# Patient Record
Sex: Female | Born: 1975 | Race: Black or African American | Hispanic: No | Marital: Married | Smoking: Former smoker
Health system: Southern US, Community
[De-identification: ages and names within clinical notes are randomized; demographics above are authoritative.]

## PROBLEM LIST (undated history)

## (undated) HISTORY — PX: FOOT SURGERY: SHX648

---

## 2003-07-31 ENCOUNTER — Other Ambulatory Visit: Admission: RE | Admit: 2003-07-31 | Discharge: 2003-07-31 | Payer: Self-pay | Admitting: Family Medicine

## 2005-03-18 ENCOUNTER — Other Ambulatory Visit: Admission: RE | Admit: 2005-03-18 | Discharge: 2005-03-18 | Payer: Self-pay | Admitting: Family Medicine

## 2005-11-10 ENCOUNTER — Ambulatory Visit: Payer: Self-pay | Admitting: Family Medicine

## 2006-12-04 ENCOUNTER — Encounter: Admission: RE | Admit: 2006-12-04 | Discharge: 2006-12-04 | Payer: Self-pay | Admitting: Family Medicine

## 2006-12-04 ENCOUNTER — Ambulatory Visit: Payer: Self-pay | Admitting: Family Medicine

## 2006-12-14 ENCOUNTER — Ambulatory Visit: Payer: Self-pay | Admitting: Family Medicine

## 2007-01-13 ENCOUNTER — Other Ambulatory Visit: Admission: RE | Admit: 2007-01-13 | Discharge: 2007-01-13 | Payer: Self-pay | Admitting: Family Medicine

## 2007-01-13 ENCOUNTER — Ambulatory Visit: Payer: Self-pay | Admitting: Family Medicine

## 2007-07-22 ENCOUNTER — Ambulatory Visit: Payer: Self-pay | Admitting: Family Medicine

## 2008-01-19 ENCOUNTER — Other Ambulatory Visit: Admission: RE | Admit: 2008-01-19 | Discharge: 2008-01-19 | Payer: Self-pay | Admitting: Family Medicine

## 2008-01-19 ENCOUNTER — Ambulatory Visit: Payer: Self-pay | Admitting: Family Medicine

## 2008-01-25 ENCOUNTER — Encounter: Admission: RE | Admit: 2008-01-25 | Discharge: 2008-01-25 | Payer: Self-pay | Admitting: Family Medicine

## 2008-05-08 ENCOUNTER — Ambulatory Visit: Payer: Self-pay | Admitting: Family Medicine

## 2008-05-17 ENCOUNTER — Encounter: Admission: RE | Admit: 2008-05-17 | Discharge: 2008-05-17 | Payer: Self-pay | Admitting: Family Medicine

## 2008-09-28 IMAGING — US US PELVIS COMPLETE
1 series · 14 of 25 positions shown · non-contrast
Comparison: None

CLINICAL DATA: Right lower quadrant pain

TRANSABDOMINAL AND TRANSVAGINAL PELVIC ULTRASOUND
TECHNIQUE: Both transabdominal and transvaginal ultrasound examinations of the
pelvis were performed including evaluation of the uterus, ovaries, adnexal
regions, and pelvic cul-de-sac.

[Series 1: unknown · 0.27mm/px · 14 of 67 slices shown]
[im 1/67]
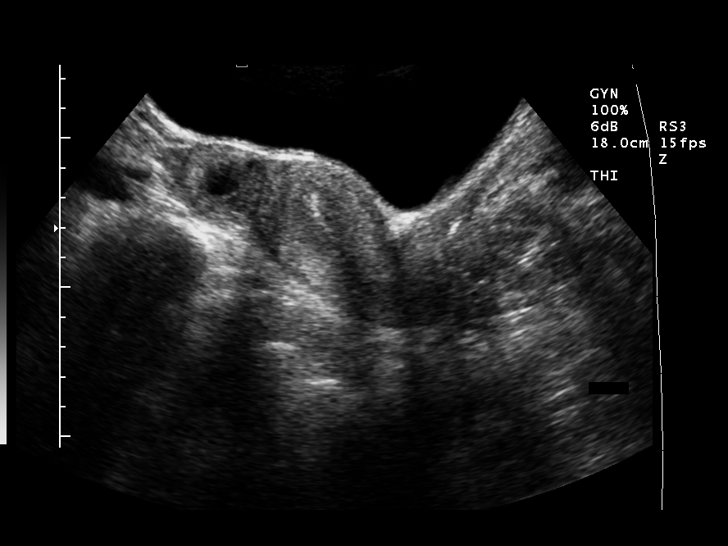
[im 6/67]
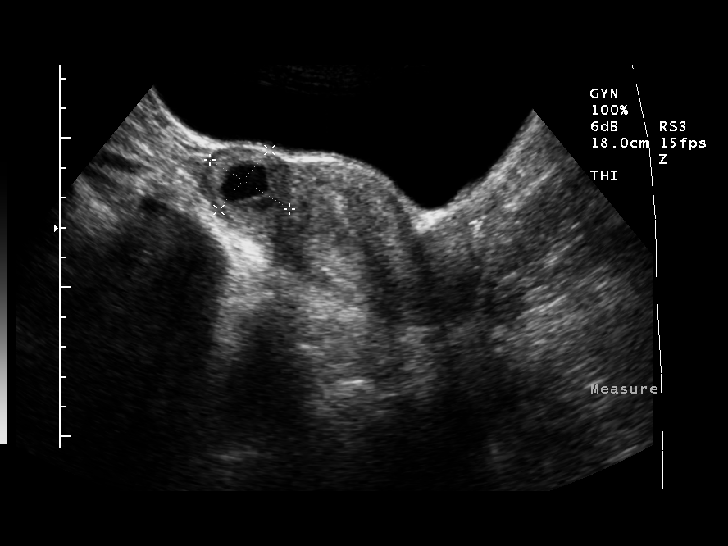
[im 12/67]
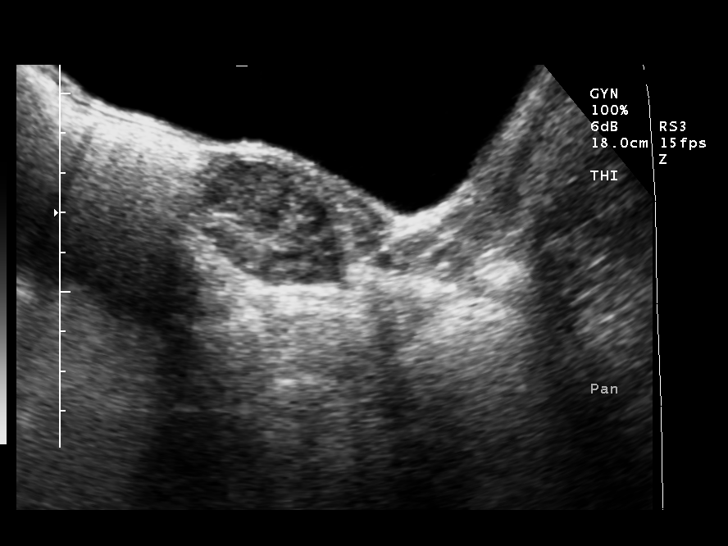
[im 17/67]
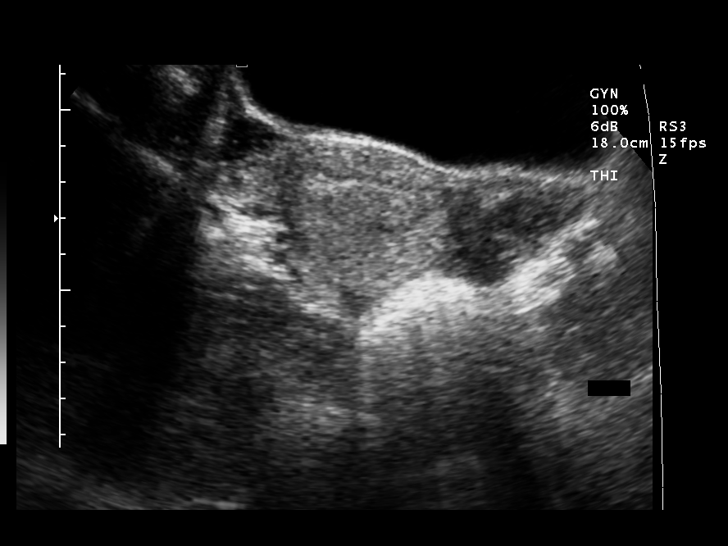
[im 23/67]
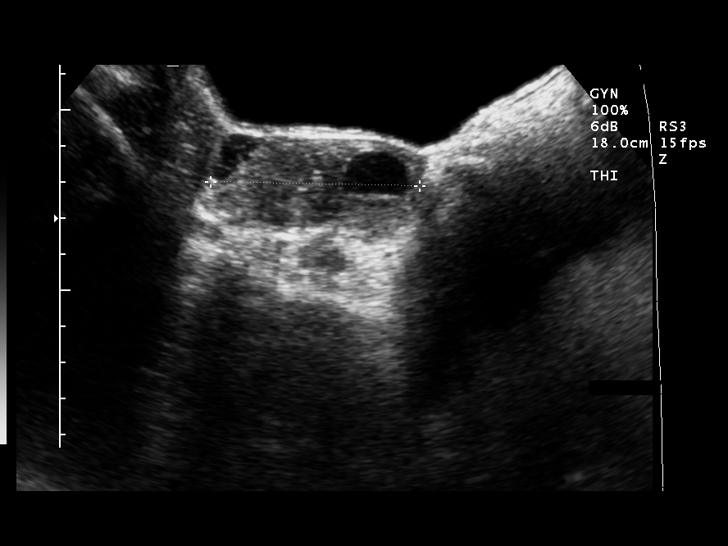
[im 25/67]
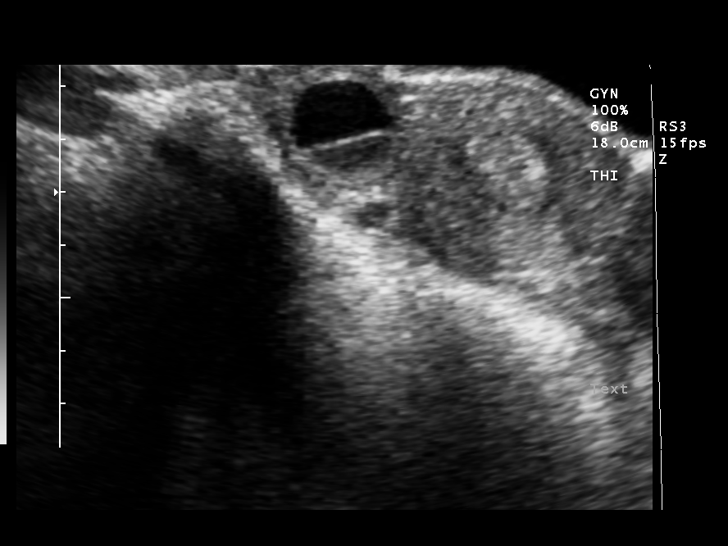
[im 31/67]
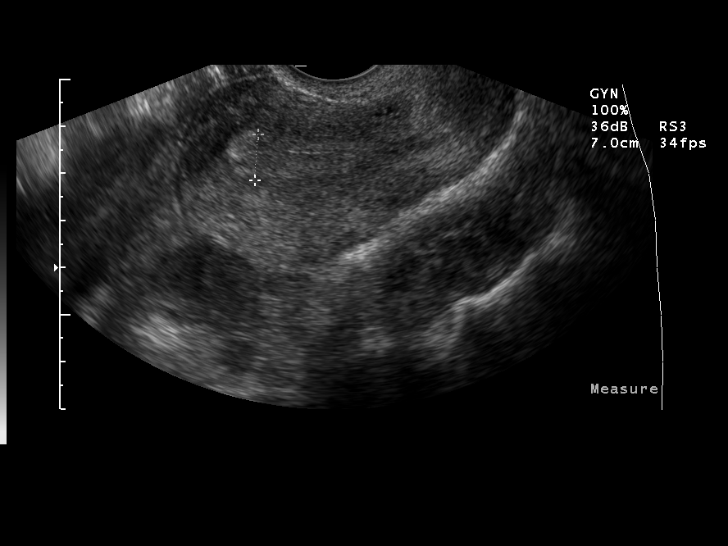
[im 36/67]
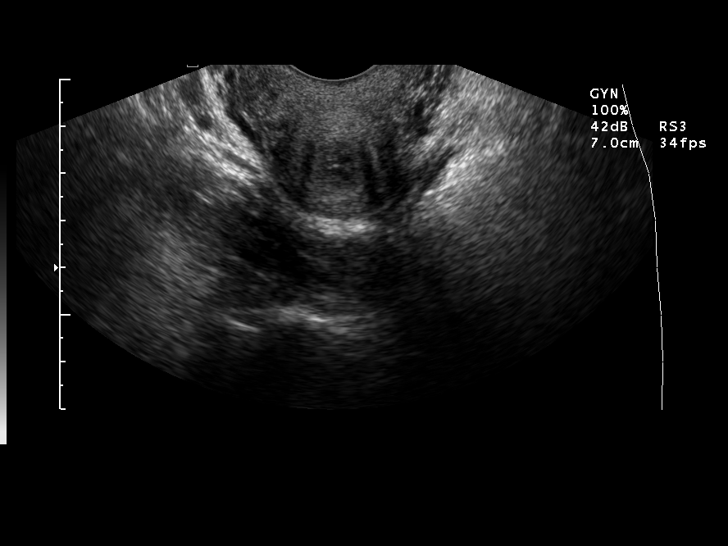
[im 42/67]
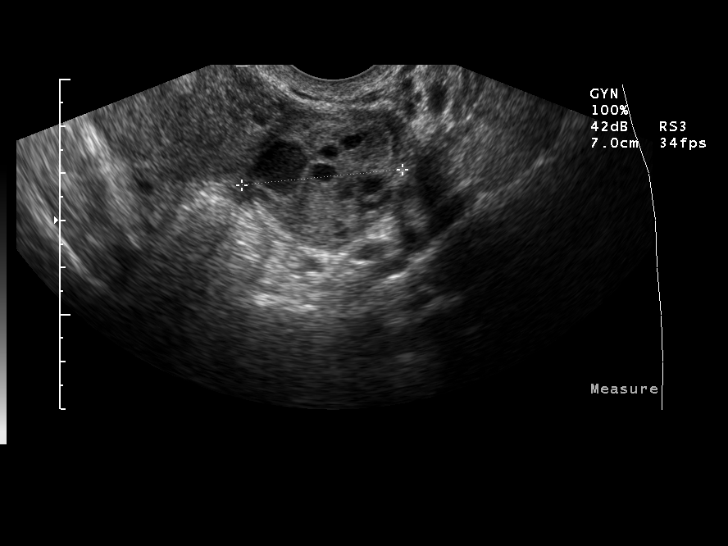
[im 45/67]
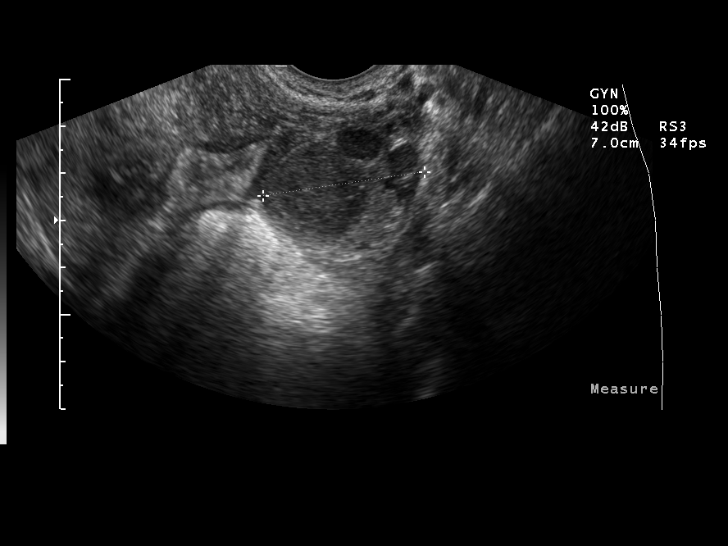
[im 50/67]
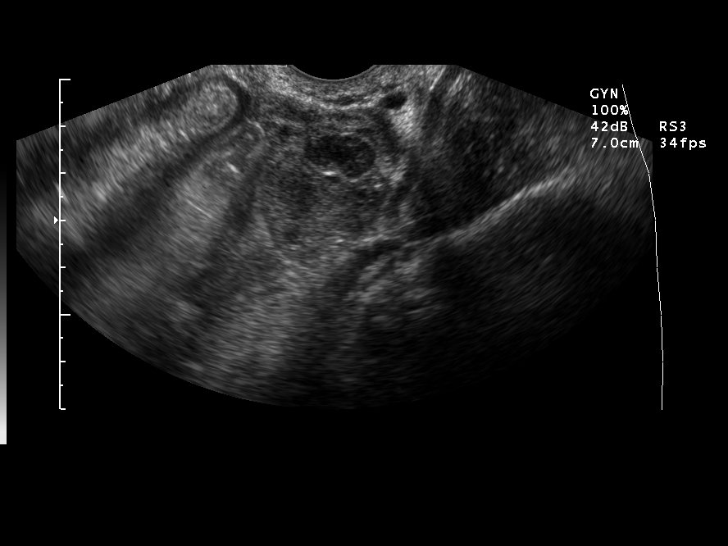
[im 56/67]
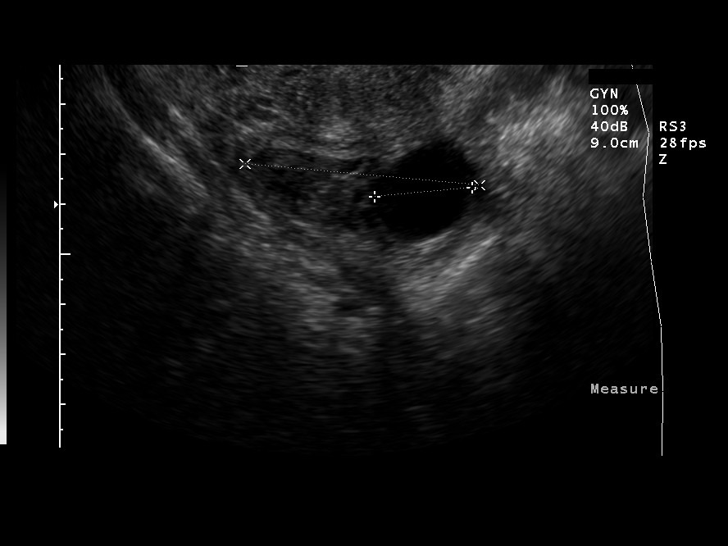
[im 61/67]
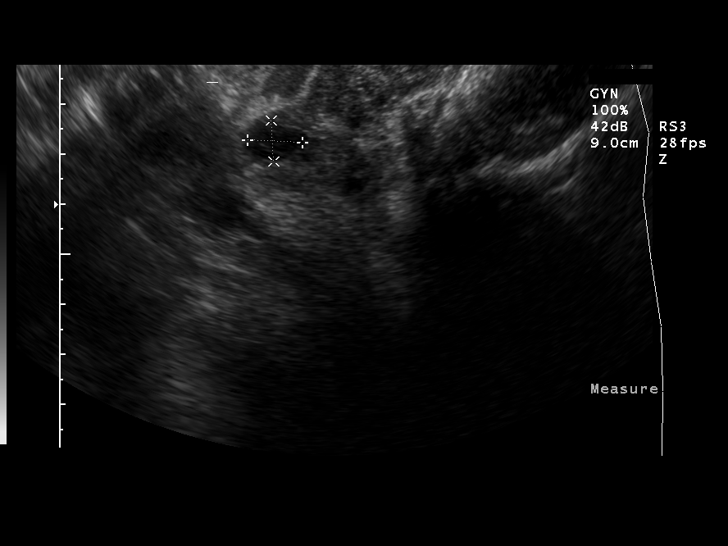
[im 67/67]
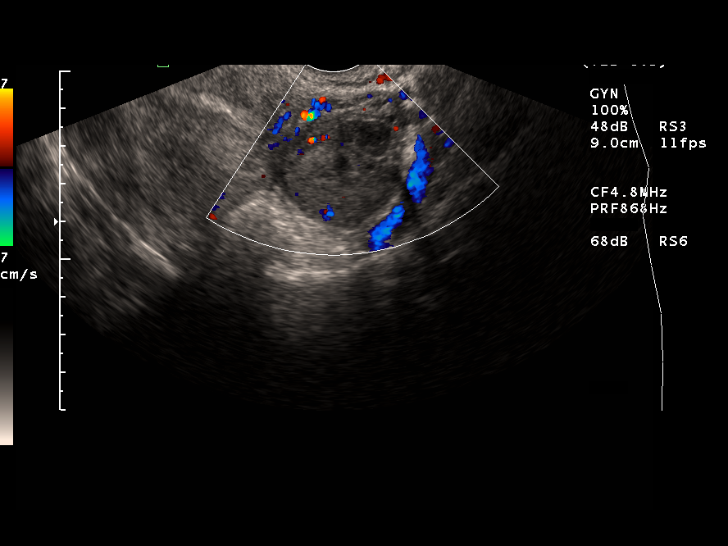

[14 of 25 positions shown; findings below may reference images not displayed]

FINDINGS: Uterus is normal size and echotexture. No focal abnormality.
Endometrium normal at 10 mm.

Right ovary measures 4.4 x 2.6 x 5.2 cm. 2 cm cyst seen within the right ovary.
Probable small 11 mm collapsing cyst.

Left ovary measures 3.6 x 3.1 x 3.4 cm. Multiple hypoechoic areas are seen
within the left ovary, the largest measuring 2.1 x 2.0 x 1.9 cm. These most
likely represent hemorrhagic cysts.

No free fluid.

IMPRESSION

Simple appearing 2 cm cyst in the right ovary.

Hypoechoic areas within the left ovary, the largest measuring 2.1 cm, most
likely hemorrhagic cysts.

## 2009-04-23 ENCOUNTER — Ambulatory Visit: Payer: Self-pay | Admitting: Physician Assistant

## 2009-04-23 ENCOUNTER — Other Ambulatory Visit: Admission: RE | Admit: 2009-04-23 | Discharge: 2009-04-23 | Payer: Self-pay | Admitting: Family Medicine

## 2009-08-02 ENCOUNTER — Other Ambulatory Visit: Admission: RE | Admit: 2009-08-02 | Discharge: 2009-08-02 | Payer: Self-pay | Admitting: Family Medicine

## 2009-08-02 ENCOUNTER — Ambulatory Visit: Payer: Self-pay | Admitting: Physician Assistant

## 2009-11-05 ENCOUNTER — Ambulatory Visit: Payer: Self-pay | Admitting: Family Medicine

## 2010-03-12 IMAGING — US US TRANSVAGINAL NON-OB
1 series · 14 of 25 positions shown · non-contrast
Comparison: 12/04/2006.

CLINICAL DATA: Dysmenorrhea.  History of ovarian cyst.

TRANSABDOMINAL AND TRANSVAGINAL ULTRASOUND OF PELVIS
TECHNIQUE: Both transabdominal and transvaginal ultrasound
examinations of the pelvis were performed including evaluation of
the uterus, ovaries, adnexal regions, and pelvic cul-de-sac.

[Series 1: us transvaginal non-ob · 0.17mm/px · 14 of 72 slices shown]
[im 1/72]
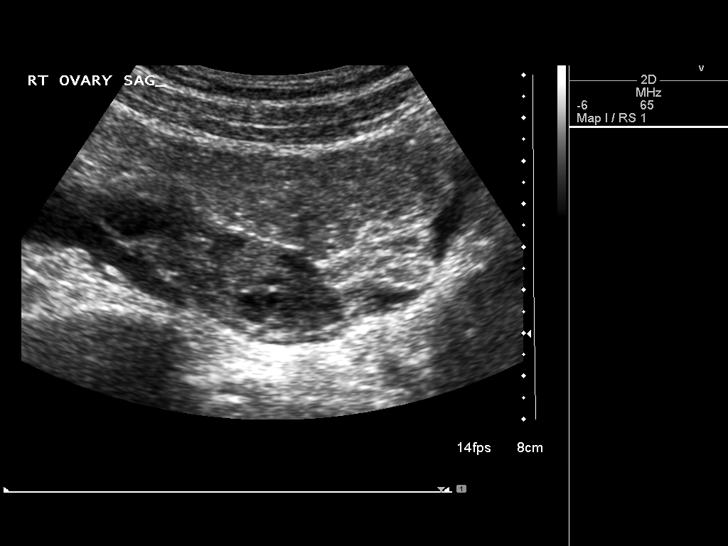
[im 6/72]
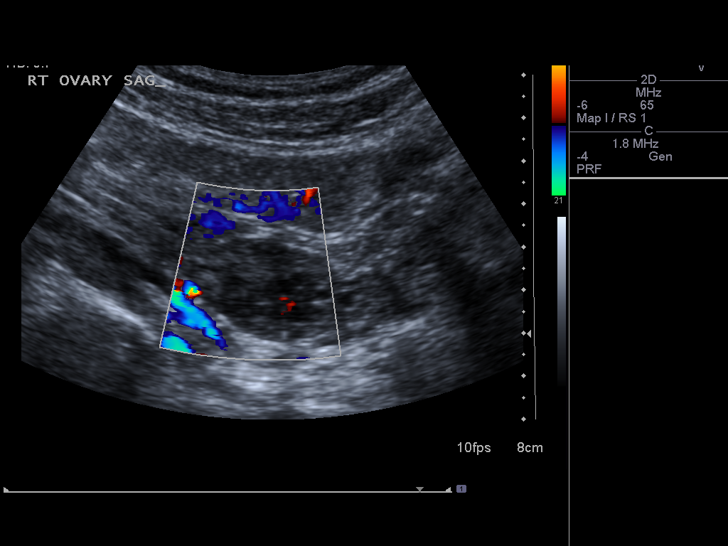
[im 12/72]
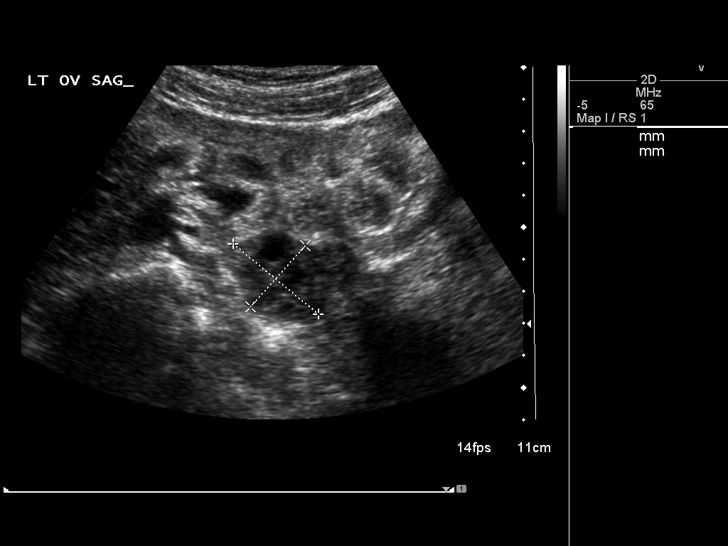
[im 18/72]
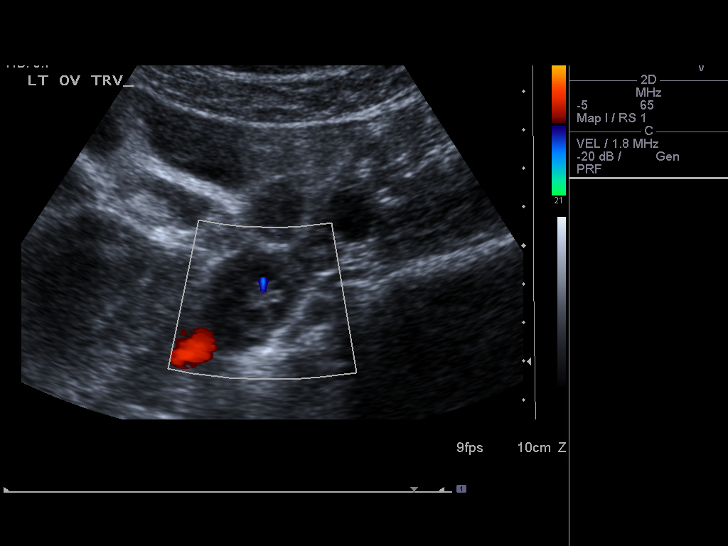
[im 24/72]
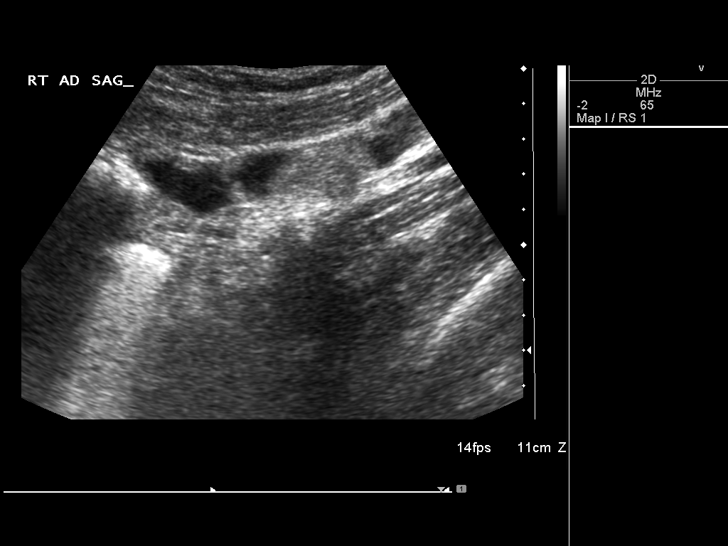
[im 27/72]
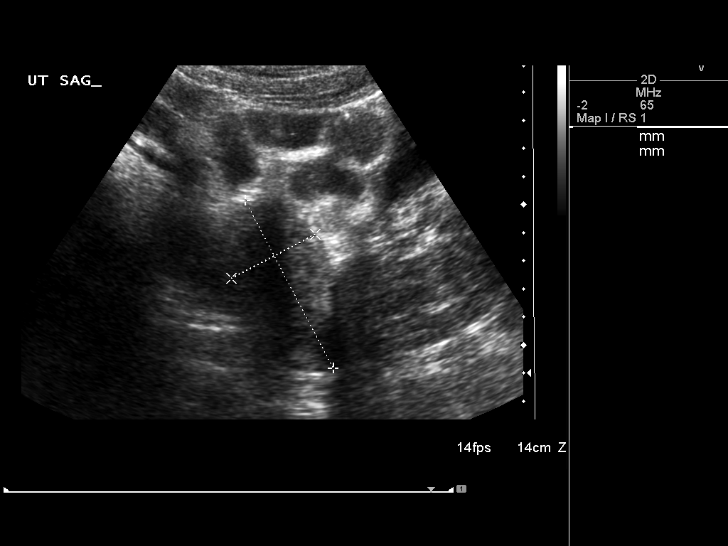
[im 33/72]
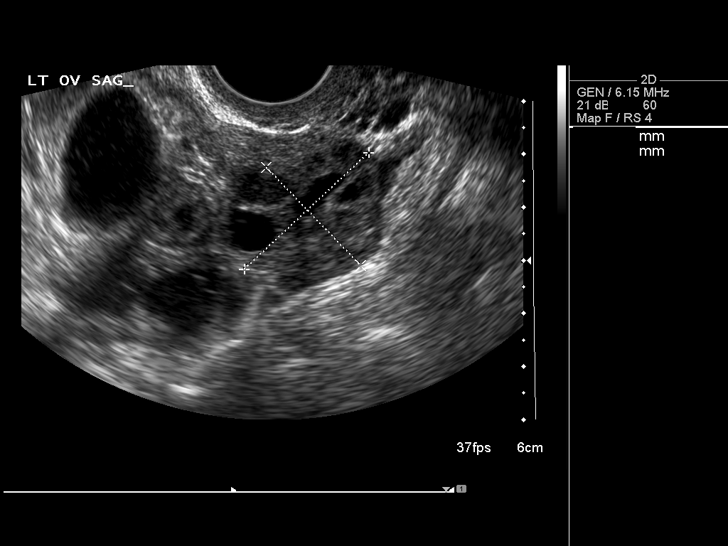
[im 39/72]
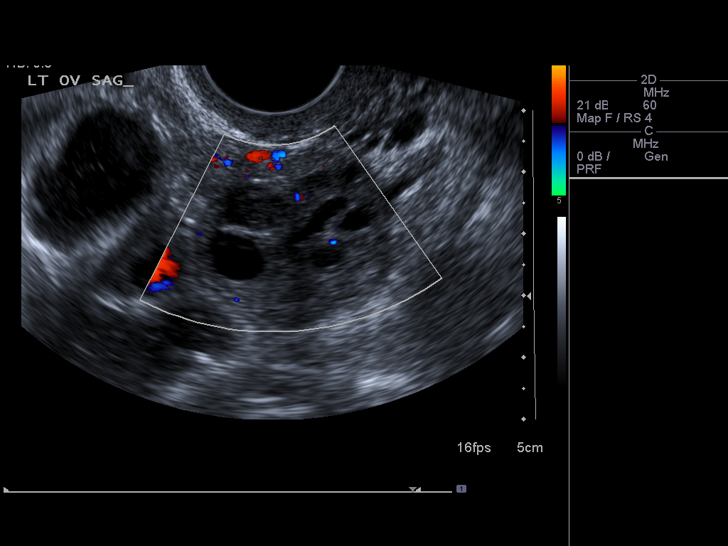
[im 45/72]
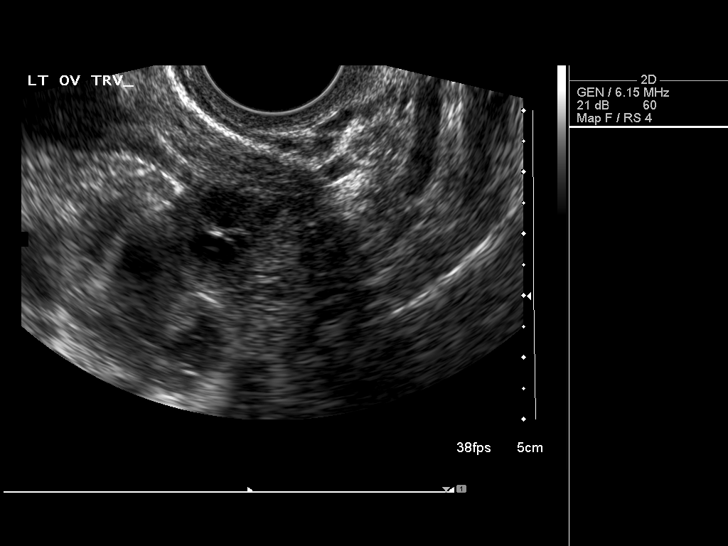
[im 48/72]
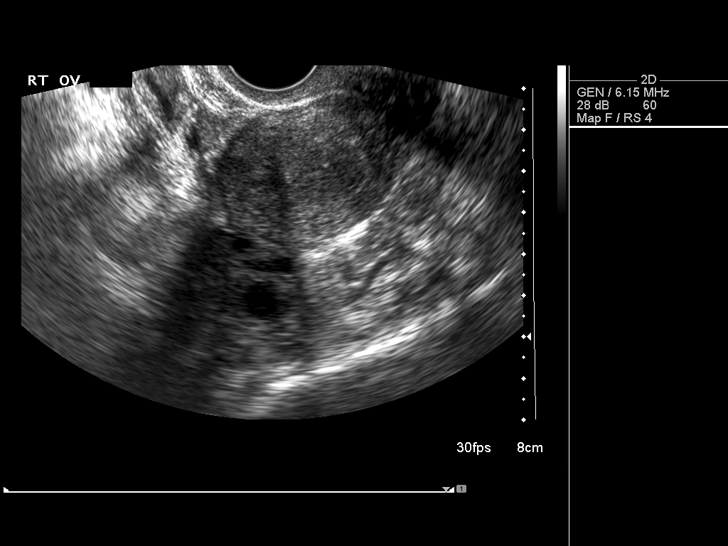
[im 54/72]
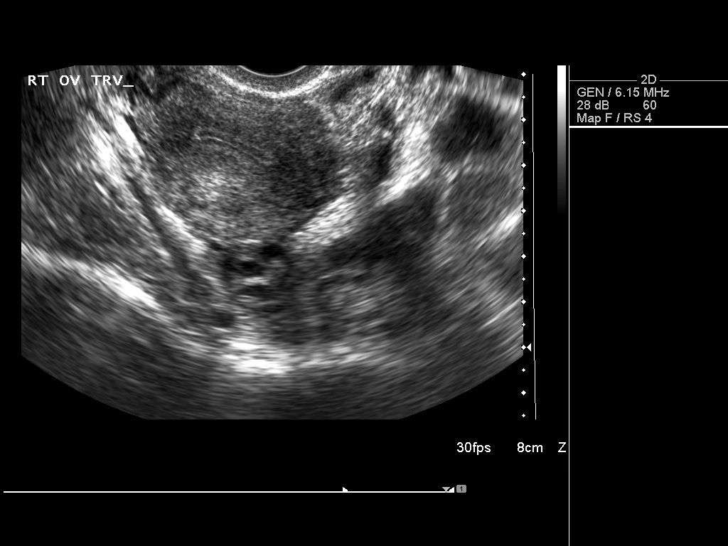
[im 60/72]
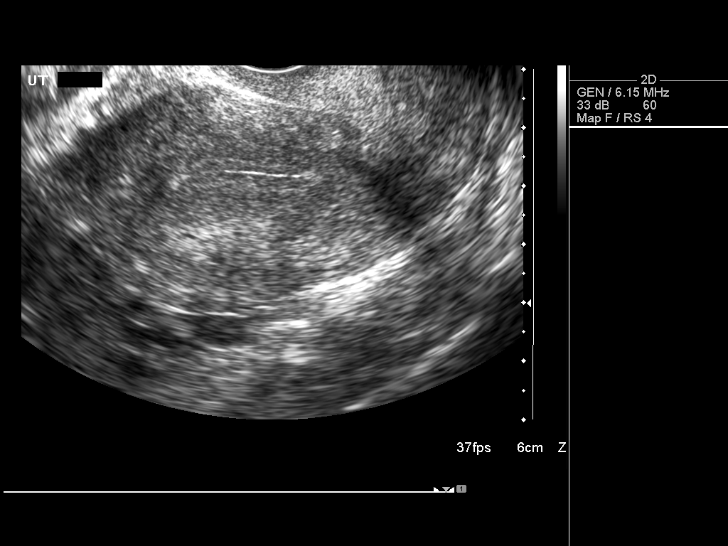
[im 66/72]
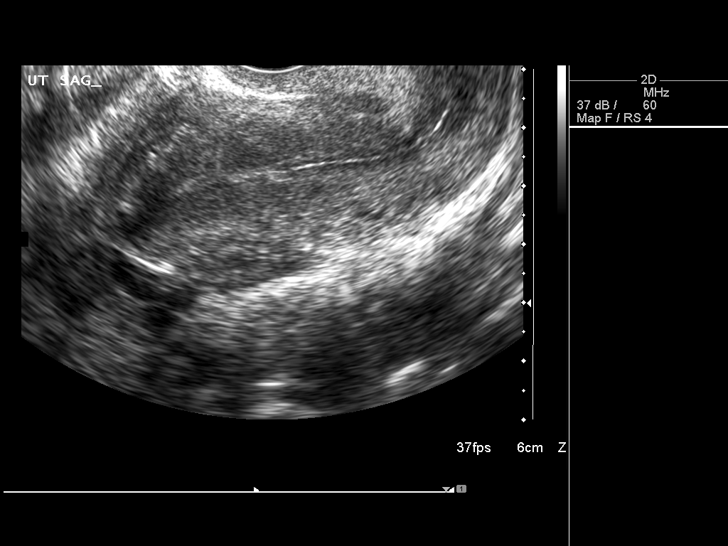
[im 72/72]
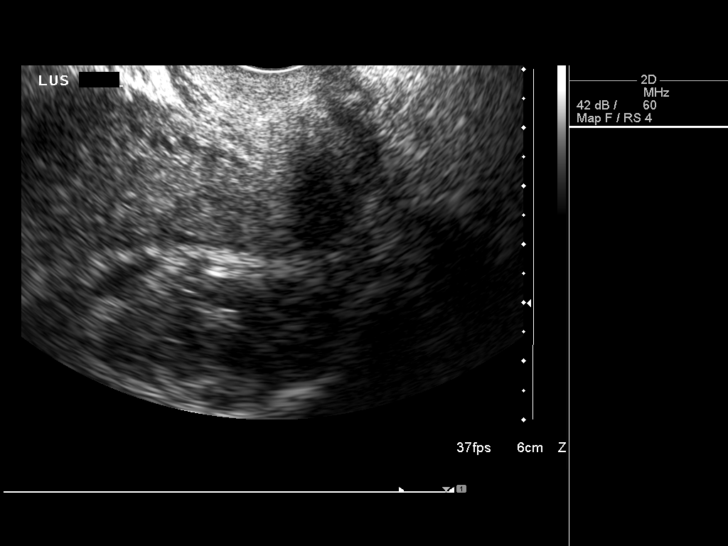

[14 of 25 positions shown; findings below may reference images not displayed]

FINDINGS: The uterus measures 6.7 x 3.6 x 4.4 cm in size.  There
are no focal uterine abnormalities.  The endometrial stripe
thickness measures 3 mm in diameter.  The right ovary measures
x 2.6 x 2.5 cm in size and the left ovary measures 3.0 x 2.0 x
cm in size.  There are small physiologic cysts associated with the
ovaries bilaterally.  There is no free pelvic fluid.
IMPRESSION: Normal study.

## 2010-03-22 ENCOUNTER — Ambulatory Visit: Payer: Self-pay | Admitting: Family Medicine

## 2010-06-02 ENCOUNTER — Encounter: Payer: Self-pay | Admitting: Family Medicine

## 2010-06-05 ENCOUNTER — Ambulatory Visit
Admission: RE | Admit: 2010-06-05 | Discharge: 2010-06-05 | Payer: Self-pay | Source: Home / Self Care | Attending: Family Medicine | Admitting: Family Medicine

## 2011-03-05 ENCOUNTER — Other Ambulatory Visit: Payer: Self-pay | Admitting: Family Medicine

## 2011-03-05 ENCOUNTER — Other Ambulatory Visit (HOSPITAL_COMMUNITY)
Admission: RE | Admit: 2011-03-05 | Discharge: 2011-03-05 | Disposition: A | Discharge status: Home / Self Care | Payer: 59 | Source: Ambulatory Visit | Attending: Family Medicine | Admitting: Family Medicine

## 2011-03-05 DIAGNOSIS — Z113 Encounter for screening for infections with a predominantly sexual mode of transmission: Secondary | ICD-10-CM | POA: Insufficient documentation

## 2011-03-05 DIAGNOSIS — Z1231 Encounter for screening mammogram for malignant neoplasm of breast: Secondary | ICD-10-CM

## 2011-03-05 DIAGNOSIS — Z803 Family history of malignant neoplasm of breast: Secondary | ICD-10-CM

## 2011-03-05 DIAGNOSIS — Z124 Encounter for screening for malignant neoplasm of cervix: Secondary | ICD-10-CM | POA: Insufficient documentation

## 2011-04-01 ENCOUNTER — Ambulatory Visit
Admission: RE | Admit: 2011-04-01 | Discharge: 2011-04-01 | Disposition: A | Discharge status: Home / Self Care | Payer: 59 | Source: Ambulatory Visit | Attending: Family Medicine | Admitting: Family Medicine

## 2011-04-01 DIAGNOSIS — Z1231 Encounter for screening mammogram for malignant neoplasm of breast: Secondary | ICD-10-CM

## 2011-04-01 DIAGNOSIS — Z803 Family history of malignant neoplasm of breast: Secondary | ICD-10-CM

## 2012-04-12 ENCOUNTER — Other Ambulatory Visit: Payer: Self-pay | Admitting: Family Medicine

## 2012-04-12 DIAGNOSIS — Z1231 Encounter for screening mammogram for malignant neoplasm of breast: Secondary | ICD-10-CM

## 2012-05-14 ENCOUNTER — Other Ambulatory Visit: Payer: Self-pay | Admitting: Family Medicine

## 2012-05-14 ENCOUNTER — Other Ambulatory Visit (HOSPITAL_COMMUNITY)
Admission: RE | Admit: 2012-05-14 | Discharge: 2012-05-14 | Disposition: A | Discharge status: Home / Self Care | Payer: 59 | Source: Ambulatory Visit | Attending: Family Medicine | Admitting: Family Medicine

## 2012-05-14 DIAGNOSIS — Z124 Encounter for screening for malignant neoplasm of cervix: Secondary | ICD-10-CM | POA: Insufficient documentation

## 2012-05-25 ENCOUNTER — Ambulatory Visit
Admission: RE | Admit: 2012-05-25 | Discharge: 2012-05-25 | Disposition: A | Discharge status: Home / Self Care | Payer: 59 | Source: Ambulatory Visit | Attending: Family Medicine | Admitting: Family Medicine

## 2012-05-25 DIAGNOSIS — Z1231 Encounter for screening mammogram for malignant neoplasm of breast: Secondary | ICD-10-CM

## 2014-07-18 ENCOUNTER — Other Ambulatory Visit: Payer: Self-pay

## 2014-07-18 DIAGNOSIS — Z1231 Encounter for screening mammogram for malignant neoplasm of breast: Secondary | ICD-10-CM

## 2014-07-27 ENCOUNTER — Ambulatory Visit
Admission: RE | Admit: 2014-07-27 | Discharge: 2014-07-27 | Disposition: A | Discharge status: Home / Self Care | Payer: 59 | Source: Ambulatory Visit

## 2014-07-27 DIAGNOSIS — Z1231 Encounter for screening mammogram for malignant neoplasm of breast: Secondary | ICD-10-CM

## 2014-09-06 ENCOUNTER — Ambulatory Visit (INDEPENDENT_AMBULATORY_CARE_PROVIDER_SITE_OTHER): Payer: 59 | Admitting: Podiatry

## 2014-09-06 ENCOUNTER — Encounter: Payer: Self-pay | Admitting: Podiatry

## 2014-09-06 ENCOUNTER — Ambulatory Visit (INDEPENDENT_AMBULATORY_CARE_PROVIDER_SITE_OTHER): Payer: 59

## 2014-09-06 ENCOUNTER — Ambulatory Visit: Payer: 59

## 2014-09-06 VITALS — BP 123/66 | HR 83 | Resp 10 | Ht 71.0 in | Wt 168.0 lb

## 2014-09-06 DIAGNOSIS — M2040 Other hammer toe(s) (acquired), unspecified foot: Secondary | ICD-10-CM

## 2014-09-06 DIAGNOSIS — M79671 Pain in right foot: Secondary | ICD-10-CM

## 2014-09-06 NOTE — Progress Notes (Signed)
   Subjective:    Patient ID: Victoria Rowe, female    DOB: August 15, 1975, 39 y.o.   MRN: [id number]  HPI Comments: Pt complains of darkening corns to B/L 2-4 toes distal dorsal and lateral left 5th toe.     Review of Systems  All other systems reviewed and are negative.      Objective:   Physical Exam        Assessment & Plan:

## 2014-09-06 NOTE — Patient Instructions (Signed)
Pre-Operative Instructions  Congratulations, you have decided to take an important step to improving your quality of life.  You can be assured that the doctors of [facility] will be with you every step of the way.  1. Plan to be at the surgery center/hospital at least 1 (one) hour prior to your scheduled time unless otherwise directed by the surgical center/hospital staff.  You must have a responsible adult accompany you, remain during the surgery and drive you home.  Make sure you have directions to the surgical center/hospital and know how to get there on time. 2. For hospital based surgery you will need to obtain a history and physical form from your family physician within 1 month prior to the date of surgery- we will give you a form for you primary physician.  3. We make every effort to accommodate the date you request for surgery.  There are however, times where surgery dates or times have to be moved.  We will contact you as soon as possible if a change in schedule is required.   4. No Aspirin/Ibuprofen for one week before surgery.  If you are on aspirin, any non-steroidal anti-inflammatory medications (Mobic, Aleve, Ibuprofen) you should stop taking it 7 days prior to your surgery.  You make take Tylenol  For pain prior to surgery.  5. Medications- If you are taking daily heart and blood pressure medications, seizure, reflux, allergy, asthma, anxiety, pain or diabetes medications, make sure the surgery center/hospital is aware before the day of surgery so they may notify you which medications to take or avoid the day of surgery. 6. No food or drink after midnight the night before surgery unless directed otherwise by surgical center/hospital staff. 7. No alcoholic beverages 24 hours prior to surgery.  No smoking 24 hours prior to or 24 hours after surgery. 8. Wear loose pants or shorts- loose enough to fit over bandages, boots, and casts. 9. No slip on shoes, sneakers are best. 10. Bring  your boot with you to the surgery center/hospital.  Also bring crutches or a walker if your physician has prescribed it for you.  If you do not have this equipment, it will be provided for you after surgery. 11. If you have not been contracted by the surgery center/hospital by the day before your surgery, call to confirm the date and time of your surgery. 12. Leave-time from work may vary depending on the type of surgery you have.  Appropriate arrangements should be made prior to surgery with your employer. 13. Prescriptions will be provided immediately following surgery by your doctor.  Have these filled as soon as possible after surgery and take the medication as directed. 14. Remove nail polish on the operative foot. 15. Wash the night before surgery.  The night before surgery wash the foot and leg well with the antibacterial soap provided and water paying special attention to beneath the toenails and in between the toes.  Rinse thoroughly with water and dry well with a towel.  Perform this wash unless told not to do so by your physician.  Enclosed: 1 Ice pack (please put in freezer the night before surgery)   1 Hibiclens skin cleaner   Pre-op Instructions  If you have any questions regarding the instructions, do not hesitate to call our office.  [place]: [address] , [place] [address] [phone]  [place]: [address] [place], [place] [address] [phone]  [employer]: [address]  [place], [place] [address] [phone]  Dr. Richard   Tuchman DPM, Dr. Norman Regal DPM Dr. Richard Sikora DPM, Dr. M. Todd Hyatt DPM, Dr. Kathryn Egerton DPM 

## 2014-09-07 NOTE — Progress Notes (Signed)
Subjective:     Patient ID: Victoria Rowe, female   DOB: 13-Aug-1975, 39 y.o.   MRN: [id number]  HPI patient presents with husband stating that I had surgery here approximate 15 years ago and I developed these corns on the third and fourth toes on my right foot fifth toe left that are becoming increasingly bothersome and making shoe gear difficult   Review of Systems  All other systems reviewed and are negative.      Objective:   Physical Exam  Constitutional: She is oriented to person, place, and time.  Cardiovascular: Intact distal pulses.   Musculoskeletal: Normal range of motion.  Neurological: She is oriented to person, place, and time.  Skin: Skin is warm.  Nursing note and vitals reviewed.  neurovascular status intact with muscle strength adequate and range of motion subtalar midtarsal joint within normal limits. Patient is noted to have distal keratotic lesions digits 34 of the right foot and noted to have distal lesion fifth digit left proximal fifth digit left that are all painful when pressed with rotation of the toes noted. 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 At this point I have recommended surgical intervention and reviewed hammertoe repair distal third and fourth right proximal fifth left and distal fifth left. I allowed patient to read consent form reviewing alternative treatments and complications listed and did explain that I may not be able to get the entire corner out on the toes and she may have some persistent discomfort and some residual deformity.  Patient wants surgery and understands the procedure as listed and after extensive review signs consent form and is scheduled for outpatient surgery. She is encouraged to call with questions should she have any questions concerning the procedures recommended for this patient and understands the told recovery period takes proximally 6 months for these types of procedures

## 2014-09-26 DIAGNOSIS — M2041 Other hammer toe(s) (acquired), right foot: Secondary | ICD-10-CM | POA: Diagnosis not present

## 2014-09-26 DIAGNOSIS — M2042 Other hammer toe(s) (acquired), left foot: Secondary | ICD-10-CM | POA: Diagnosis not present

## 2014-09-27 ENCOUNTER — Telehealth: Payer: Self-pay | Admitting: *Deleted

## 2014-09-27 NOTE — Telephone Encounter (Signed)
Courtesy call - left message encouraging pt to rest, be weight bearing no more than 5 minutes/hour, keep the surgical boot on at all times and call with concerns.

## 2014-10-02 ENCOUNTER — Ambulatory Visit (INDEPENDENT_AMBULATORY_CARE_PROVIDER_SITE_OTHER): Payer: 59

## 2014-10-02 ENCOUNTER — Ambulatory Visit (INDEPENDENT_AMBULATORY_CARE_PROVIDER_SITE_OTHER): Payer: 59 | Admitting: Podiatry

## 2014-10-02 VITALS — BP 117/61 | HR 72 | Resp 15

## 2014-10-02 DIAGNOSIS — Z9889 Other specified postprocedural states: Secondary | ICD-10-CM

## 2014-10-02 NOTE — Progress Notes (Signed)
Subjective:     Patient ID: Victoria Rowe, female   DOB: 09-06-1975, 39 y.o.   MRN: [id number]  HPI patient presents stating I'm doing well with minimal discomfort and pain and I have left the dressings on until now   Review of Systems     Objective:   Physical Exam Neurovascular status intact negative Homans sign noted with third and fourth digits right and fifth digit left in excellent position with wound edges well coapted and good digital alignment noted    Assessment:     Doing well post arthroplasty distal third and fourth right fifth left with good alignment noted of the underlying digits    Plan:     X-rays reviewed and sterile dressings reapplied. Advised on continued immobilization and reappoint 2 weeks for suture removal or earlier if any issues should occur

## 2014-10-17 ENCOUNTER — Ambulatory Visit: Payer: 59 | Admitting: Podiatry

## 2014-10-17 ENCOUNTER — Encounter: Payer: Self-pay | Admitting: Podiatry

## 2014-10-17 VITALS — BP 137/86 | HR 66 | Resp 12

## 2014-10-17 DIAGNOSIS — Z9889 Other specified postprocedural states: Secondary | ICD-10-CM

## 2014-10-17 DIAGNOSIS — M2040 Other hammer toe(s) (acquired), unspecified foot: Secondary | ICD-10-CM

## 2014-10-17 NOTE — Progress Notes (Signed)
Subjective:     Patient ID: Victoria Rowe, female   DOB: [birth year], 39 y.o.   MRN: [id number]  HPI patient states I'm doing really well with my toes   Review of Systems     Objective:   Physical Exam Neurovascular status intact well positioned third fourth toe right fifth toe left with stitches intact and wound edges well coapted    Assessment:     Doing well post arthroplasty bilateral    Plan:     Reviewed condition remove stitches and advised on open toed shoes for the next 4 weeks. Gradual return to activity and reappoint for us to recheck
